# Patient Record
Sex: Female | Born: 1985 | Race: White | Hispanic: No | Marital: Married | Smoking: Never smoker
Health system: Southern US, Community
[De-identification: ages and names within clinical notes are randomized; demographics above are authoritative.]

## PROBLEM LIST (undated history)

## (undated) DIAGNOSIS — E0500 Thyrotoxicosis with diffuse goiter without thyrotoxic crisis or storm: Secondary | ICD-10-CM

## (undated) DIAGNOSIS — Z3483 Encounter for supervision of other normal pregnancy, third trimester: Secondary | ICD-10-CM

## (undated) DIAGNOSIS — Z98891 History of uterine scar from previous surgery: Secondary | ICD-10-CM

## (undated) HISTORY — PX: APPENDECTOMY: SHX54

## (undated) HISTORY — PX: BREAST CYST EXCISION: SHX579

---

## 2016-10-20 DIAGNOSIS — Z8639 Personal history of other endocrine, nutritional and metabolic disease: Secondary | ICD-10-CM | POA: Insufficient documentation

## 2017-05-15 DIAGNOSIS — E890 Postprocedural hypothyroidism: Secondary | ICD-10-CM | POA: Insufficient documentation

## 2017-06-19 LAB — OB RESULTS CONSOLE ABO/RH: RH Type: POSITIVE

## 2017-06-19 LAB — OB RESULTS CONSOLE ANTIBODY SCREEN: ANTIBODY SCREEN: NEGATIVE

## 2017-06-19 LAB — OB RESULTS CONSOLE HEPATITIS B SURFACE ANTIGEN: HEP B S AG: NEGATIVE

## 2017-06-19 LAB — OB RESULTS CONSOLE GC/CHLAMYDIA
CHLAMYDIA, DNA PROBE: NEGATIVE
Gonorrhea: NEGATIVE

## 2017-06-19 LAB — OB RESULTS CONSOLE RPR: RPR: NONREACTIVE

## 2017-06-19 LAB — OB RESULTS CONSOLE HIV ANTIBODY (ROUTINE TESTING): HIV: NONREACTIVE

## 2017-06-19 LAB — OB RESULTS CONSOLE RUBELLA ANTIBODY, IGM: RUBELLA: IMMUNE

## 2017-12-14 LAB — OB RESULTS CONSOLE GBS: GBS: POSITIVE

## 2017-12-20 ENCOUNTER — Encounter (HOSPITAL_COMMUNITY): Payer: Self-pay | Admitting: Obstetrics and Gynecology

## 2017-12-20 DIAGNOSIS — Z3483 Encounter for supervision of other normal pregnancy, third trimester: Secondary | ICD-10-CM

## 2017-12-20 HISTORY — DX: Encounter for supervision of other normal pregnancy, third trimester: Z34.83

## 2017-12-23 ENCOUNTER — Encounter (HOSPITAL_COMMUNITY): Payer: Self-pay | Admitting: *Deleted

## 2018-01-06 ENCOUNTER — Encounter (HOSPITAL_COMMUNITY)
Admission: RE | Admit: 2018-01-06 | Discharge: 2018-01-06 | Disposition: A | Discharge status: Home / Self Care | Payer: 59 | Source: Ambulatory Visit | Attending: Obstetrics and Gynecology | Admitting: Obstetrics and Gynecology

## 2018-01-06 HISTORY — DX: Thyrotoxicosis with diffuse goiter without thyrotoxic crisis or storm: E05.00

## 2018-01-06 LAB — CBC
HEMATOCRIT: 35.4 % — AB (ref 36.0–46.0)
HEMOGLOBIN: 11.9 g/dL — AB (ref 12.0–15.0)
MCH: 28.9 pg (ref 26.0–34.0)
MCHC: 33.6 g/dL (ref 30.0–36.0)
MCV: 85.9 fL (ref 78.0–100.0)
Platelets: 258 10*3/uL (ref 150–400)
RBC: 4.12 MIL/uL (ref 3.87–5.11)
RDW: 13.2 % (ref 11.5–15.5)
WBC: 7.9 10*3/uL (ref 4.0–10.5)

## 2018-01-06 LAB — TYPE AND SCREEN
ABO/RH(D): O POS
ANTIBODY SCREEN: NEGATIVE

## 2018-01-06 LAB — ABO/RH: ABO/RH(D): O POS

## 2018-01-06 NOTE — Anesthesia Preprocedure Evaluation (Addendum)
Anesthesia Evaluation  Patient identified by MRN, date of birth, ID band Patient awake    Reviewed: Allergy & Precautions, NPO status , Patient's Chart, lab work & pertinent test results  Airway Mallampati: II  TM Distance: >3 FB Neck ROM: Full    Dental no notable dental hx. (+) Teeth Intact, Dental Advisory Given   Pulmonary neg pulmonary ROS,    Pulmonary exam normal breath sounds clear to auscultation       Cardiovascular Exercise Tolerance: Good negative cardio ROS Normal cardiovascular exam Rhythm:Regular Rate:Normal     Neuro/Psych negative neurological ROS  negative psych ROS   GI/Hepatic negative GI ROS, Neg liver ROS,   Endo/Other  Hyperthyroidism   Renal/GU negative Renal ROS  negative genitourinary   Musculoskeletal   Abdominal   Peds  Hematology negative hematology ROS (+)   Anesthesia Other Findings   Reproductive/Obstetrics (+) Pregnancy                            Lab Results  Component Value Date   WBC 7.9 01/06/2018   HGB 11.9 (L) 01/06/2018   HCT 35.4 (L) 01/06/2018   MCV 85.9 01/06/2018   PLT 258 01/06/2018    Anesthesia Physical Anesthesia Plan  ASA: II  Anesthesia Plan: Spinal   Post-op Pain Management:    Induction:   PONV Risk Score and Plan: Treatment may vary due to age or medical condition  Airway Management Planned: Nasal Cannula and Natural Airway  Additional Equipment:   Intra-op Plan:   Post-operative Plan:   Informed Consent: I have reviewed the patients History and Physical, chart, labs and discussed the procedure including the risks, benefits and alternatives for the proposed anesthesia with the patient or authorized representative who has indicated his/her understanding and acceptance.   Dental advisory given  Plan Discussed with: CRNA  Anesthesia Plan Comments: (c/s)       Anesthesia Quick Evaluation

## 2018-01-06 NOTE — H&P (Signed)
Christina Sampson is a 32 y.o. female G2P1001 at 39+ for rLTCS.  Pt desires repeat and fetus is breech.  D/w pt r/b/a of LTCS also BTL by salpingectomy.  Pt desires no future fertility.  Relatively uncomplicated PNC - has thyroid dz - on synthroid.  Also w [facility] that resolved.  Dated by LMP cw First tri [place].  Received Tdap in [facility]  OB History    Gravida  2   Para  1   Term  1   Preterm      AB      Living  1     SAB      TAB      Ectopic      Multiple      Live Births  1         G33 G1 6#10, Br, LTCS female G2 present  + abn pap , colpo, nl since'no STD  Past Medical History:  Diagnosis Date  . Graves disease   . Normal pregnancy in multigravida in third trimester 12/20/2017   Past Surgical History:  Procedure Laterality Date  . APPENDECTOMY    . CESAREAN SECTION     Family History: family history includes Diabetes in her father. thyroid disease Social History:  reports that she has never smoked. She has never used smokeless tobacco. She reports that she does not drink alcohol or use drugs.married, wellness coach Meds levothyroxine, PNV All NKDA     Maternal Diabetes: No Genetic Screening: Declined Maternal Ultrasounds/Referrals: Abnormal:  Findings:   Isolated choroid plexus cyst Fetal Ultrasounds or other Referrals:  None Maternal Substance Abuse:  No Significant Maternal Medications:  None Significant Maternal Lab Results:  Lab values include: Group B Strep positive Other Comments:  None  Review of Systems  Constitutional: Negative.   HENT: Negative.   Eyes: Negative.   Respiratory: Negative.   Cardiovascular: Negative.   Gastrointestinal: Negative.   Genitourinary: Negative.   Musculoskeletal: Negative.   Skin: Negative.   Neurological: Negative.   Psychiatric/Behavioral: Negative.    Maternal Medical History:  Contractions: Frequency: irregular.    Fetal activity: Perceived fetal activity is normal.    Prenatal Complications - Diabetes:  none.      Last menstrual period 04/09/2017. Maternal Exam:  Uterine Assessment: Contraction frequency is irregular.   Abdomen: Patient reports no abdominal tenderness. Surgical scars: low transverse.   Fundal height is appropriate for gestation.   Estimated fetal weight is 7-7.5#.   Fetal presentation: breech  Introitus: Normal vulva. Normal vagina.    Physical Exam  Constitutional: She is oriented to person, place, and time. She appears well-developed and well-nourished.  HENT:  Head: Normocephalic and atraumatic.  Cardiovascular: Normal rate and regular rhythm.  Respiratory: Breath sounds normal. No respiratory distress. She has no wheezes.  GI: Soft. Bowel sounds are normal. She exhibits no distension. There is no tenderness.  Musculoskeletal: Normal range of motion.  Neurological: She is alert and oriented to person, place, and time.  Skin: Skin is warm and dry.  Psychiatric: She has a normal mood and affect. Her behavior is normal.    Prenatal labs: ABO, Rh: --/--/O POS, O POS Performed at [facility], [address] [place], [place] [address]  [phone]/28 0930) Antibody: NEG (08/28 0930) Rubella: Immune (02/08 0000) RPR: Nonreactive (02/08 0000)  HBsAg: Negative (02/08 0000)  HIV: Non-reactive (02/08 0000)  GBS: Positive (08/05 0000)  Tdap 6/14  Declined genetic screening H/o LLP - resolved Isolated CP cyst Hgb 13.2/Plt  2493/UrCx neg/Chl neg/GC neg/Varicell aimmune/TSH WNL/glucola 90  Nl anat, post plac, br Good growth, br  Assessment/Plan: 32yo G2P1001 at 39 for rLTCS/BTL D/w pt r/b/a of both Ancef for prophylaxis    Wong Steadham Bovard-Stuckert 01/06/2018, 10:28 PM

## 2018-01-06 NOTE — Patient Instructions (Signed)
Lucilla LameBrooke Sako  01/06/2018   Your procedure is scheduled on:  01/07/2018  Enter through the Main Entrance of [facility] at 0730 AM.  Pick up the phone at the desk and dial [phone]  Call this number if you have problems the morning of surgery:[phone]  Remember:   Do not eat food:(After Midnight) Desps de medianoche.  Do not drink clear liquids: (After Midnight) Desps de medianoche.  Take these medicines the morning of surgery with A SIP OF WATER: may take zantac, do take synthroid   Do not wear jewelry, make-up or nail polish.  Do not wear lotions, powders, or perfumes. Do not wear deodorant.  Do not shave 48 hours prior to surgery.  Do not bring valuables to the hospital.  [facility] is not   responsible for any belongings or valuables brought to the hospital.  Contacts, dentures or bridgework may not be worn into surgery.  Leave suitcase in the car. After surgery it may be brought to your room.  For patients admitted to the hospital, checkout time is 11:00 AM the day of              discharge.    N/A   Please read over the following fact sheets that you were given:   Surgical Site Infection Prevention

## 2018-01-07 ENCOUNTER — Inpatient Hospital Stay (HOSPITAL_COMMUNITY)
Admission: AD | Admit: 2018-01-07 | Discharge: 2018-01-09 | DRG: 785 | Disposition: A | Discharge status: Home / Self Care | Payer: 59 | Attending: Obstetrics and Gynecology | Admitting: Obstetrics and Gynecology

## 2018-01-07 ENCOUNTER — Encounter (HOSPITAL_COMMUNITY): Payer: Self-pay | Admitting: *Deleted

## 2018-01-07 ENCOUNTER — Encounter (HOSPITAL_COMMUNITY)
Admission: AD | Disposition: A | Discharge status: Home / Self Care | Payer: Self-pay | Source: Home / Self Care | Attending: Obstetrics and Gynecology

## 2018-01-07 ENCOUNTER — Inpatient Hospital Stay (HOSPITAL_COMMUNITY): Payer: 59 | Admitting: Anesthesiology

## 2018-01-07 DIAGNOSIS — O321XX0 Maternal care for breech presentation, not applicable or unspecified: Secondary | ICD-10-CM | POA: Diagnosis present

## 2018-01-07 DIAGNOSIS — O34211 Maternal care for low transverse scar from previous cesarean delivery: Secondary | ICD-10-CM | POA: Diagnosis present

## 2018-01-07 DIAGNOSIS — O99284 Endocrine, nutritional and metabolic diseases complicating childbirth: Secondary | ICD-10-CM | POA: Diagnosis present

## 2018-01-07 DIAGNOSIS — Z302 Encounter for sterilization: Secondary | ICD-10-CM

## 2018-01-07 DIAGNOSIS — Z98891 History of uterine scar from previous surgery: Secondary | ICD-10-CM

## 2018-01-07 DIAGNOSIS — O99824 Streptococcus B carrier state complicating childbirth: Secondary | ICD-10-CM | POA: Diagnosis present

## 2018-01-07 DIAGNOSIS — Z3A39 39 weeks gestation of pregnancy: Secondary | ICD-10-CM | POA: Diagnosis not present

## 2018-01-07 DIAGNOSIS — O358XX0 Maternal care for other (suspected) fetal abnormality and damage, not applicable or unspecified: Secondary | ICD-10-CM | POA: Diagnosis present

## 2018-01-07 DIAGNOSIS — E0500 Thyrotoxicosis with diffuse goiter without thyrotoxic crisis or storm: Secondary | ICD-10-CM | POA: Diagnosis present

## 2018-01-07 DIAGNOSIS — Z3483 Encounter for supervision of other normal pregnancy, third trimester: Secondary | ICD-10-CM

## 2018-01-07 HISTORY — DX: History of uterine scar from previous surgery: Z98.891

## 2018-01-07 HISTORY — DX: Encounter for supervision of other normal pregnancy, third trimester: Z34.83

## 2018-01-07 LAB — RPR: RPR: NONREACTIVE

## 2018-01-07 SURGERY — Surgical Case
Anesthesia: Spinal

## 2018-01-07 MED ORDER — ZOLPIDEM TARTRATE 5 MG PO TABS: 5.0000 mg | ORAL_TABLET | Freq: Every evening | ORAL | Status: DC | PRN

## 2018-01-07 MED ORDER — NALBUPHINE HCL 10 MG/ML IJ SOLN: 5.0000 mg | Freq: Once | INTRAMUSCULAR | Status: DC | PRN

## 2018-01-07 MED ORDER — MEPERIDINE HCL 25 MG/ML IJ SOLN: 6.2500 mg | INTRAMUSCULAR | Status: DC | PRN

## 2018-01-07 MED ORDER — PHENYLEPHRINE HCL 10 MG/ML IJ SOLN
INTRAMUSCULAR | Status: DC | PRN
  Administered 2018-01-07: 80 ug via INTRAVENOUS

## 2018-01-07 MED ORDER — LEVOTHYROXINE SODIUM 137 MCG PO TABS
137.0000 ug | ORAL_TABLET | Freq: Every day | ORAL | Status: DC
  Administered 2018-01-08 – 2018-01-09 (×2): 137 ug via ORAL
  Filled 2018-01-07 (×2): qty 1

## 2018-01-07 MED ORDER — OXYTOCIN 10 UNIT/ML IJ SOLN
INTRAMUSCULAR | Status: AC
  Filled 2018-01-07: qty 4

## 2018-01-07 MED ORDER — SCOPOLAMINE 1 MG/3DAYS TD PT72
MEDICATED_PATCH | TRANSDERMAL | Status: AC
  Filled 2018-01-07: qty 1

## 2018-01-07 MED ORDER — KETOROLAC TROMETHAMINE 30 MG/ML IJ SOLN: 30.0000 mg | Freq: Four times a day (QID) | INTRAMUSCULAR | Status: AC | PRN

## 2018-01-07 MED ORDER — FENTANYL CITRATE (PF) 100 MCG/2ML IJ SOLN
INTRAMUSCULAR | Status: DC | PRN
  Administered 2018-01-07: 15 ug via INTRATHECAL

## 2018-01-07 MED ORDER — PROMETHAZINE HCL 25 MG/ML IJ SOLN: 6.2500 mg | INTRAMUSCULAR | Status: DC | PRN

## 2018-01-07 MED ORDER — SCOPOLAMINE 1 MG/3DAYS TD PT72
MEDICATED_PATCH | TRANSDERMAL | Status: DC | PRN
  Administered 2018-01-07: 1 via TRANSDERMAL

## 2018-01-07 MED ORDER — SCOPOLAMINE 1 MG/3DAYS TD PT72: 1.0000 | MEDICATED_PATCH | Freq: Once | TRANSDERMAL | Status: DC

## 2018-01-07 MED ORDER — NALOXONE HCL 0.4 MG/ML IJ SOLN: 0.4000 mg | INTRAMUSCULAR | Status: DC | PRN

## 2018-01-07 MED ORDER — WITCH HAZEL-GLYCERIN EX PADS: 1.0000 "application " | MEDICATED_PAD | CUTANEOUS | Status: DC | PRN

## 2018-01-07 MED ORDER — LACTATED RINGERS IV SOLN
INTRAVENOUS | Status: DC
  Administered 2018-01-07 (×3): via INTRAVENOUS

## 2018-01-07 MED ORDER — DIPHENHYDRAMINE HCL 50 MG/ML IJ SOLN: 12.5000 mg | INTRAMUSCULAR | Status: DC | PRN

## 2018-01-07 MED ORDER — CEFAZOLIN SODIUM-DEXTROSE 2-4 GM/100ML-% IV SOLN
2.0000 g | INTRAVENOUS | Status: AC
  Administered 2018-01-07: 2 g via INTRAVENOUS
  Filled 2018-01-07: qty 100

## 2018-01-07 MED ORDER — DIPHENHYDRAMINE HCL 25 MG PO CAPS: 25.0000 mg | ORAL_CAPSULE | Freq: Four times a day (QID) | ORAL | Status: DC | PRN

## 2018-01-07 MED ORDER — OXYTOCIN 10 UNIT/ML IJ SOLN
INTRAVENOUS | Status: DC | PRN
  Administered 2018-01-07: 40 [IU] via INTRAVENOUS

## 2018-01-07 MED ORDER — FENTANYL CITRATE (PF) 100 MCG/2ML IJ SOLN
INTRAMUSCULAR | Status: AC
  Filled 2018-01-07: qty 2

## 2018-01-07 MED ORDER — PHENYLEPHRINE 8 MG IN D5W 100 ML (0.08MG/ML) PREMIX OPTIME
INJECTION | INTRAVENOUS | Status: DC | PRN
  Administered 2018-01-07: 60 ug/min via INTRAVENOUS

## 2018-01-07 MED ORDER — FAMOTIDINE 20 MG PO TABS
20.0000 mg | ORAL_TABLET | Freq: Every day | ORAL | Status: DC
  Administered 2018-01-07: 20 mg via ORAL
  Filled 2018-01-07 (×3): qty 1

## 2018-01-07 MED ORDER — SODIUM CHLORIDE 0.9% FLUSH: 3.0000 mL | INTRAVENOUS | Status: DC | PRN

## 2018-01-07 MED ORDER — SIMETHICONE 80 MG PO CHEW
80.0000 mg | CHEWABLE_TABLET | Freq: Three times a day (TID) | ORAL | Status: DC
  Administered 2018-01-07 – 2018-01-09 (×5): 80 mg via ORAL
  Filled 2018-01-07 (×5): qty 1

## 2018-01-07 MED ORDER — PHENYLEPHRINE 8 MG IN D5W 100 ML (0.08MG/ML) PREMIX OPTIME
INJECTION | INTRAVENOUS | Status: AC
  Filled 2018-01-07: qty 100

## 2018-01-07 MED ORDER — LACTATED RINGERS IV SOLN
INTRAVENOUS | Status: DC | PRN
  Administered 2018-01-07: 10:00:00 via INTRAVENOUS

## 2018-01-07 MED ORDER — DIBUCAINE 1 % RE OINT: 1.0000 "application " | TOPICAL_OINTMENT | RECTAL | Status: DC | PRN

## 2018-01-07 MED ORDER — ONDANSETRON HCL 4 MG/2ML IJ SOLN: 4.0000 mg | Freq: Three times a day (TID) | INTRAMUSCULAR | Status: DC | PRN

## 2018-01-07 MED ORDER — ONDANSETRON HCL 4 MG/2ML IJ SOLN
INTRAMUSCULAR | Status: AC
  Filled 2018-01-07: qty 2

## 2018-01-07 MED ORDER — MORPHINE SULFATE (PF) 0.5 MG/ML IJ SOLN
INTRAMUSCULAR | Status: DC | PRN
  Administered 2018-01-07: .15 mg via INTRATHECAL

## 2018-01-07 MED ORDER — OXYTOCIN 40 UNITS IN LACTATED RINGERS INFUSION - SIMPLE MED: 2.5000 [IU]/h | INTRAVENOUS | Status: AC

## 2018-01-07 MED ORDER — DEXAMETHASONE SODIUM PHOSPHATE 4 MG/ML IJ SOLN
INTRAMUSCULAR | Status: AC
  Filled 2018-01-07: qty 1

## 2018-01-07 MED ORDER — BUPIVACAINE IN DEXTROSE 0.75-8.25 % IT SOLN
INTRATHECAL | Status: DC | PRN
  Administered 2018-01-07: 1.6 mL via INTRATHECAL

## 2018-01-07 MED ORDER — LACTATED RINGERS IV SOLN
INTRAVENOUS | Status: DC
  Administered 2018-01-07: 22:00:00 via INTRAVENOUS

## 2018-01-07 MED ORDER — OXYCODONE HCL 5 MG PO TABS: 10.0000 mg | ORAL_TABLET | ORAL | Status: DC | PRN

## 2018-01-07 MED ORDER — NALOXONE HCL 4 MG/10ML IJ SOLN
1.0000 ug/kg/h | INTRAVENOUS | Status: DC | PRN
  Filled 2018-01-07: qty 5

## 2018-01-07 MED ORDER — SODIUM CHLORIDE 0.9 % IR SOLN
Status: DC | PRN
  Administered 2018-01-07: 1

## 2018-01-07 MED ORDER — OXYCODONE HCL 5 MG PO TABS: 5.0000 mg | ORAL_TABLET | ORAL | Status: DC | PRN

## 2018-01-07 MED ORDER — DEXAMETHASONE SODIUM PHOSPHATE 4 MG/ML IJ SOLN
INTRAMUSCULAR | Status: DC | PRN
  Administered 2018-01-07: 4 mg via INTRAVENOUS

## 2018-01-07 MED ORDER — SENNOSIDES-DOCUSATE SODIUM 8.6-50 MG PO TABS
2.0000 | ORAL_TABLET | ORAL | Status: DC
  Administered 2018-01-07 – 2018-01-08 (×2): 2 via ORAL
  Filled 2018-01-07 (×2): qty 2

## 2018-01-07 MED ORDER — SIMETHICONE 80 MG PO CHEW
80.0000 mg | CHEWABLE_TABLET | ORAL | Status: DC | PRN
  Administered 2018-01-08: 80 mg via ORAL
  Filled 2018-01-07: qty 1

## 2018-01-07 MED ORDER — KETOROLAC TROMETHAMINE 30 MG/ML IJ SOLN
30.0000 mg | Freq: Four times a day (QID) | INTRAMUSCULAR | Status: AC | PRN
  Administered 2018-01-07: 30 mg via INTRAMUSCULAR

## 2018-01-07 MED ORDER — NALBUPHINE HCL 10 MG/ML IJ SOLN: 5.0000 mg | INTRAMUSCULAR | Status: DC | PRN

## 2018-01-07 MED ORDER — PHENYLEPHRINE 40 MCG/ML (10ML) SYRINGE FOR IV PUSH (FOR BLOOD PRESSURE SUPPORT)
PREFILLED_SYRINGE | INTRAVENOUS | Status: AC
  Filled 2018-01-07: qty 10

## 2018-01-07 MED ORDER — ACETAMINOPHEN 325 MG PO TABS: 650.0000 mg | ORAL_TABLET | ORAL | Status: DC | PRN

## 2018-01-07 MED ORDER — MORPHINE SULFATE (PF) 0.5 MG/ML IJ SOLN
INTRAMUSCULAR | Status: AC
  Filled 2018-01-07: qty 10

## 2018-01-07 MED ORDER — DIPHENHYDRAMINE HCL 25 MG PO CAPS
25.0000 mg | ORAL_CAPSULE | ORAL | Status: DC | PRN
  Filled 2018-01-07: qty 1

## 2018-01-07 MED ORDER — KETOROLAC TROMETHAMINE 30 MG/ML IJ SOLN
INTRAMUSCULAR | Status: AC
  Filled 2018-01-07: qty 1

## 2018-01-07 MED ORDER — ACETAMINOPHEN 10 MG/ML IV SOLN: 1000.0000 mg | Freq: Once | INTRAVENOUS | Status: DC | PRN

## 2018-01-07 MED ORDER — ONDANSETRON HCL 4 MG/2ML IJ SOLN
INTRAMUSCULAR | Status: DC | PRN
  Administered 2018-01-07: 4 mg via INTRAVENOUS

## 2018-01-07 MED ORDER — SIMETHICONE 80 MG PO CHEW
80.0000 mg | CHEWABLE_TABLET | ORAL | Status: DC
  Administered 2018-01-07: 80 mg via ORAL
  Filled 2018-01-07: qty 1

## 2018-01-07 MED ORDER — PRENATAL MULTIVITAMIN CH
1.0000 | ORAL_TABLET | Freq: Every day | ORAL | Status: DC
  Administered 2018-01-08 – 2018-01-09 (×2): 1 via ORAL
  Filled 2018-01-07 (×2): qty 1

## 2018-01-07 MED ORDER — IBUPROFEN 800 MG PO TABS
800.0000 mg | ORAL_TABLET | Freq: Three times a day (TID) | ORAL | Status: DC
  Administered 2018-01-07 – 2018-01-09 (×5): 800 mg via ORAL
  Filled 2018-01-07 (×5): qty 1

## 2018-01-07 MED ORDER — HYDROCODONE-ACETAMINOPHEN 7.5-325 MG PO TABS: 1.0000 | ORAL_TABLET | Freq: Once | ORAL | Status: DC | PRN

## 2018-01-07 MED ORDER — HYDROMORPHONE HCL 1 MG/ML IJ SOLN: 0.2500 mg | INTRAMUSCULAR | Status: DC | PRN

## 2018-01-07 MED ORDER — MENTHOL 3 MG MT LOZG: 1.0000 | LOZENGE | OROMUCOSAL | Status: DC | PRN

## 2018-01-07 MED ORDER — COCONUT OIL OIL: 1.0000 "application " | TOPICAL_OIL | Status: DC | PRN

## 2018-01-07 SURGICAL SUPPLY — 34 items
BENZOIN TINCTURE PRP APPL 2/3 (GAUZE/BANDAGES/DRESSINGS) ×2 IMPLANT
CHLORAPREP W/TINT 26ML (MISCELLANEOUS) ×2 IMPLANT
CLAMP CORD UMBIL (MISCELLANEOUS) ×2 IMPLANT
CLOTH BEACON ORANGE TIMEOUT ST (SAFETY) ×2 IMPLANT
DRSG OPSITE POSTOP 4X10 (GAUZE/BANDAGES/DRESSINGS) ×2 IMPLANT
ELECT REM PT RETURN 9FT ADLT (ELECTROSURGICAL) ×2
ELECTRODE REM PT RTRN 9FT ADLT (ELECTROSURGICAL) ×1 IMPLANT
EXTRACTOR VACUUM M CUP 4 TUBE (SUCTIONS) IMPLANT
GLOVE BIO SURGEON STRL SZ 6.5 (GLOVE) ×2 IMPLANT
GLOVE BIOGEL PI IND STRL 7.0 (GLOVE) ×1 IMPLANT
GLOVE BIOGEL PI INDICATOR 7.0 (GLOVE) ×1
GOWN STRL REUS W/TWL LRG LVL3 (GOWN DISPOSABLE) ×4 IMPLANT
KIT ABG SYR 3ML LUER SLIP (SYRINGE) IMPLANT
NEEDLE HYPO 25X5/8 SAFETYGLIDE (NEEDLE) IMPLANT
NS IRRIG 1000ML POUR BTL (IV SOLUTION) ×2 IMPLANT
PACK C SECTION WH (CUSTOM PROCEDURE TRAY) ×2 IMPLANT
PAD OB MATERNITY 4.3X12.25 (PERSONAL CARE ITEMS) ×2 IMPLANT
PENCIL SMOKE EVAC W/HOLSTER (ELECTROSURGICAL) ×2 IMPLANT
RTRCTR C-SECT PINK 25CM LRG (MISCELLANEOUS) ×2 IMPLANT
STRIP CLOSURE SKIN 1/2X4 (GAUZE/BANDAGES/DRESSINGS) ×2 IMPLANT
SUT MNCRL 0 VIOLET CTX 36 (SUTURE) ×2 IMPLANT
SUT MONOCRYL 0 CTX 36 (SUTURE) ×2
SUT PLAIN 1 NONE 54 (SUTURE) ×2 IMPLANT
SUT PLAIN 2 0 (SUTURE) ×1
SUT PLAIN 2 0 XLH (SUTURE) ×2 IMPLANT
SUT PLAIN ABS 2-0 CT1 27XMFL (SUTURE) ×1 IMPLANT
SUT VIC AB 0 CT1 27 (SUTURE) ×2
SUT VIC AB 0 CT1 27XBRD ANBCTR (SUTURE) ×2 IMPLANT
SUT VIC AB 2-0 CT1 27 (SUTURE) ×1
SUT VIC AB 2-0 CT1 TAPERPNT 27 (SUTURE) ×1 IMPLANT
SUT VIC AB 4-0 KS 27 (SUTURE) ×2 IMPLANT
SYR BULB IRRIGATION 50ML (SYRINGE) ×2 IMPLANT
TOWEL OR 17X24 6PK STRL BLUE (TOWEL DISPOSABLE) ×2 IMPLANT
TRAY FOLEY W/BAG SLVR 14FR LF (SET/KITS/TRAYS/PACK) ×2 IMPLANT

## 2018-01-07 NOTE — Op Note (Signed)
Christina Sampson, Christina Sampson MEDICAL RECORD [id number] ACCOUNT [id number] DATE OF BIRTH:08-25-1985 FACILITY: [facility] LOCATION: [id number] PHYSICIAN:Mehtab Dolberry BOVARD-STUCKERT, MD  OPERATIVE REPORT  DATE OF PROCEDURE:  01/07/2018  PREOPERATIVE DIAGNOSES:  Intrauterine pregnancy at term history of low transverse cesarean section, desires repeat, undesired fertility.  POSTOPERATIVE DIAGNOSES:  Intrauterine pregnancy at term history of low transverse cesarean section, desires repeat, undesired fertility, delivered.  PROCEDURE:  Repeat low transverse cesarean section with bilateral tubal ligation by bilateral salpingectomy.  SURGEON:  Sherron Monday, M.D.  ASSISTANT:  Annice Pih, RNFA.  ANESTHESIA:  Spinal.  ESTIMATED BLOOD LOSS:  303 mL  URINE OUTPUT AND IV FLUIDS:  Per anesthesia.  Urine was clear.  Urine was clear at the end of the case.  FINDINGS:  A viable female infant named Jackson at 9:52 a.m. with Apgars of 9 at 1 minute and 9 at 5 minutes and a weight pending at the time of dictation.  Normal uterus, tubes, and ovaries were also noted.  COMPLICATIONS:  None.  PATHOLOGY:  Placenta to labor and delivery, bilateral tubal segments to pathology.  DESCRIPTION OF PROCEDURE:  After informed consent was reviewed with the patient and her spouse, she was transported to the operating room where spinal anesthesia was placed and found to be adequate.  She was then returned to the supine position with a  leftward tilt, prepped and draped in the normal sterile fashion.  A Foley catheter was sterilely placed.  A Pfannenstiel skin incision was made at the level 2 fingerbreadths above the pubic symphysis where her previous incision was carried through the  underlying layer of fascia sharply.  The fascia was incised in the midline.  The incision was extended laterally with Mayo scissors.  Superior aspect of the fascial incision was grasped with Kocher clamps, elevated and the rectus muscles were dissected   off both bluntly and sharply.  The midline was identified.  The peritoneum was entered bluntly and the incision was extended superiorly and inferiorly with good visualization of the bladder.  An Alexis skin retractor was placed carefully making sure that  no bowel was entrapped.  The bladder flap was created.  The uterus was incised in a transverse fashion and the incision was extended manually.  The infant was delivered from a frank breech presentation.  There was meconium from the pressure of delivery;  however, there was none prior to the stress of delivery.  The infant was delivered up to the level of the shoulders.  Shoulders were then reduced and the head was flexed and delivered.  He was dried and stimulated.  Mouth was suctioned, cord was clamped  and cut after waiting a minute.  The infant was handed off to the waiting NICU staff. The placenta was expressed.  The uterus was cleared of all clot and debris.  Uterine incision was closed in two layers of 0 Monocryl, first of which is running locked  and the second as an imbricating layer.  The uterus was noted to be hemostatic.  Attention was turned to the tubes.  First, the left tube was identified, followed out to the fimbriated end and using a Tresa Endo was excised and then doubly ligated with plain  gut.  This was noted to be hemostatic.  The right tube was then identified, followed out to the fimbriated end and excised using a Tresa Endo, also found to be hemostatic after doubly ligating it with plain gut.  The Alexis retractor was removed.  The  peritoneum was reapproximated with 2-0 Vicryl.  The subfascial planes were inspected and found to be hemostatic and the fascia was then reapproximated with 0 Vicryl from either corner overlapping in the midline.  The subcuticular plane was inspected and  made hemostatic with Bovie cautery.  The dead space was then closed with plain gut.  The skin was closed with a 4-0 Vicryl on a Keith needle.  Benzoin and  Steri-Strips were applied.  The patient tolerated the procedure well.  Sponge, lap and needle count  was correct x2 per the operating staff.  TN/NUANCE  D:01/07/2018 T:01/07/2018 JOB:[id number]

## 2018-01-07 NOTE — Anesthesia Procedure Notes (Signed)
Spinal  Patient location during procedure: OB Start time: 01/07/2018 9:28 AM End time: 01/07/2018 9:36 AM Staffing Anesthesiologist: Trevor IhaHouser, Mayanna Garlitz A, MD Performed: anesthesiologist  Preanesthetic Checklist Completed: patient identified, surgical consent, pre-op evaluation, timeout performed, IV checked, risks and benefits discussed and monitors and equipment checked Spinal Block Patient position: sitting Prep: site prepped and draped and DuraPrep Patient monitoring: heart rate, cardiac monitor, continuous pulse ox and blood pressure Approach: midline Location: L3-4 Injection technique: single-shot Needle Needle type: Pencan  Needle gauge: 24 G Needle length: 10 cm Needle insertion depth: 7 cm Assessment Sensory level: T4 Additional Notes 1 attempt at L4-5 by SRNA student , 2 attempts at L3 - L4,  Pt tolerated procedure well

## 2018-01-07 NOTE — Transfer of Care (Signed)
Immediate Anesthesia Transfer of Care Note  Patient: Christina Sampson  Procedure(s) Performed: REPEAT CESAREAN SECTION (N/A )  Patient Location: PACU  Anesthesia Type:Spinal  Level of Consciousness: awake, alert  and oriented  Airway & Oxygen Therapy: Patient Spontanous Breathing  Post-op Assessment: Report given to RN and Post -op Vital signs reviewed and stable  Post vital signs: Reviewed and stable  Last Vitals:  Vitals Value Taken Time  BP    Temp    Pulse    Resp    SpO2      Last Pain:  Vitals:   01/07/18 0758  TempSrc: Oral  PainSc: 0-No pain      Patients Stated Pain Goal: 4 (01/07/18 0758)  Complications: No apparent anesthesia complications

## 2018-01-07 NOTE — Lactation Note (Signed)
This note was copied from a baby's chart. Lactation Consultation Note  Patient Name: Christina Sampson Inabinet RUEAV'WToday's Date: 01/07/2018 Reason for consult: Initial assessment;Other (Comment);Term(2nd baby, per mom pumped x 1 year due to Difficult time BF baby, )  Baby is 8 hours old, " [name] "  Per mom the baby latched right after delivery in the OR.  Baby awake, and LC assisted mom to latch after showing her hand expressing, and mom able  To return demo after several tries.  LC recommended to mom prior to latch on the 1st breast - breast massage, hand express, pre-pump if needed ( #24 F is a good fit ) and reverse pressure.  Baby opened wide and latched with depth with assistance, and fed for 36 mins, multiple swallows noted and increased with breast compressions. Per mom comfortable with entire latch.  Nipple slightly slanted when baby released. Football position used.  Mom and dad expressed excitement that the baby is breast feeding so well, because their 1st baby  Did not latch well.  LC instructed mom on the use of breast shells between feedings except  When sleeping, and hand pump ( #24 F is a good fit ).  Mother informed of post-discharge support and given phone number to the lactation department, including services for phone call assistance; out-patient appointments; and breastfeeding support group. List of other breastfeeding resources in the community given in the handout. Encouraged mother to call for problems or concerns related to breastfeeding.   Maternal Data Has patient been taught Hand Expression?: Yes(mom returned demo/ with several drops ) Does the patient have breastfeeding experience prior to this delivery?: Yes  Feeding Feeding Type: Breast Fed Length of feed: 36 min(multiple swallows/ increased with breast compressions )  LATCH Score Latch: Grasps breast easily, tongue down, lips flanged, rhythmical sucking.  Audible Swallowing: Spontaneous and intermittent  Type of  Nipple: Everted at rest and after stimulation(short shaft nipple/ some areola edema - indicating shells , and reverse pressure )  Comfort (Breast/Nipple): Soft / non-tender  Hold (Positioning): Assistance needed to correctly position infant at breast and maintain latch.  LATCH Score: 9  Interventions Interventions: Breast feeding basics reviewed;Assisted with latch;Skin to skin;Breast massage;Hand express;Breast compression;Adjust position;Support pillows;Position options;Expressed milk;Shells;Hand pump  Lactation Tools Discussed/Used Tools: Shells;Pump Shell Type: Inverted Breast pump type: Manual WIC Program: No Pump Review: Setup, frequency, and cleaning;Milk Storage Initiated by:: MAI  Date initiated:: 01/07/18   Consult Status Consult Status: Follow-up Date: 01/08/18 Follow-up type: In-patient    Matilde SprangMargaret Ann Krishana Lutze 01/07/2018, 6:46 PM

## 2018-01-07 NOTE — Addendum Note (Signed)
Addendum  created 01/07/18 1946 by Anh Bigos, CRNA   Sign clinical note    

## 2018-01-07 NOTE — Anesthesia Postprocedure Evaluation (Signed)
Anesthesia Post Note  Patient: Lucilla LameBrooke Nie  Procedure(s) Performed: REPEAT CESAREAN SECTION (N/A )     Patient location during evaluation: PACU Anesthesia Type: Spinal Level of consciousness: oriented and awake and alert Pain management: pain level controlled Vital Signs Assessment: post-procedure vital signs reviewed and stable Respiratory status: spontaneous breathing, respiratory function stable and patient connected to nasal cannula oxygen Cardiovascular status: blood pressure returned to baseline and stable Postop Assessment: no headache, no backache and no apparent nausea or vomiting Anesthetic complications: no    Last Vitals:  Vitals:   01/07/18 1500 01/07/18 1545  BP: 97/64 96/67  Pulse: (!) 52 84  Resp: 18 18  Temp: 36.6 C 36.4 C  SpO2: 99% 99%    Last Pain:  Vitals:   01/07/18 1545  TempSrc: Axillary  PainSc: 3    Pain Goal: Patients Stated Pain Goal: 4 (01/07/18 0758)               Trevor IhaStephen A Justa Hatchell

## 2018-01-07 NOTE — Anesthesia Postprocedure Evaluation (Signed)
Anesthesia Post Note  Patient: Christina Sampson  Procedure(s) Performed: REPEAT CESAREAN SECTION (N/A )     Patient location during evaluation: Mother Baby Anesthesia Type: Spinal Level of consciousness: oriented and awake and alert Pain management: pain level controlled Vital Signs Assessment: post-procedure vital signs reviewed and stable Respiratory status: spontaneous breathing and respiratory function stable Cardiovascular status: blood pressure returned to baseline and stable Postop Assessment: no headache, no backache and no apparent nausea or vomiting Anesthetic complications: no    Last Vitals:  Vitals:   01/07/18 1500 01/07/18 1545  BP: 97/64 96/67  Pulse: (!) 52 84  Resp: 18 18  Temp: 36.6 C 36.4 C  SpO2: 99% 99%    Last Pain:  Vitals:   01/07/18 1545  TempSrc: Axillary  PainSc: 3    Pain Goal: Patients Stated Pain Goal: 4 (01/07/18 0758)               Junious SilkGILBERT,Lydiann Bonifas

## 2018-01-07 NOTE — Brief Op Note (Signed)
01/07/2018  10:54 AM  PATIENT:  Christina Sampson  32 y.o. female  PRE-OPERATIVE DIAGNOSIS:  repeat C-Section undesired fertility   POST-OPERATIVE DIAGNOSIS:  repeat C-Section undesired fertility   PROCEDURE:  Procedure(s) with comments: REPEAT CESAREAN SECTION (N/A) - Heather, RNFA  SURGEON:  Surgeon(s) and Role:    * Bovard-Stuckert, Clarence Dunsmore, MD - Primary  ASSISTANTS: Krietemeyer, heather RNFA   ANESTHESIA:   spinal  EBL:  303 mL  uop and IVF per anesthesia, clear urine at end of case.    FINDINGS: viable female infant at 9:52, apgars 9/9, wt P; nl uterus, tubes and ovaries  BLOOD ADMINISTERED:none  DRAINS: Urinary Catheter (Foley)   LOCAL MEDICATIONS USED:  NONE  SPECIMEN:  Source of Specimen:  Placenta; B tubal segments  DISPOSITION OF SPECIMEN:  L&D; pathology  COUNTS:  YES  TOURNIQUET:  * No tourniquets in log *  DICTATION: .Other Dictation: Dictation Number [id number]  PLAN OF CARE: Admit to inpatient   PATIENT DISPOSITION:  PACU - hemodynamically stable.   Delay start of Pharmacological VTE agent (>24hrs) due to surgical blood loss or risk of bleeding: not applicable

## 2018-01-07 NOTE — Interval H&P Note (Signed)
History and Physical Interval Note:  01/07/2018 9:01 AM  Christina Sampson  has presented today for surgery, with the diagnosis of repeat C-Section  The various methods of treatment have been discussed with the patient and family. After consideration of risks, benefits and other options for treatment, the patient has consented to  Procedure(s) with comments: REPEAT CESAREAN SECTION (N/A) - Heather, RNFA as a surgical intervention .  The patient's history has been reviewed, patient examined, no change in status, stable for surgery.  I have reviewed the patient's chart and labs.  Questions were answered to the patient's satisfaction.     Melinda Gwinner Bovard-Stuckert

## 2018-01-08 LAB — CBC
HCT: 31.3 % — ABNORMAL LOW (ref 36.0–46.0)
Hemoglobin: 10.4 g/dL — ABNORMAL LOW (ref 12.0–15.0)
MCH: 28.4 pg (ref 26.0–34.0)
MCHC: 33.2 g/dL (ref 30.0–36.0)
MCV: 85.5 fL (ref 78.0–100.0)
PLATELETS: 207 10*3/uL (ref 150–400)
RBC: 3.66 MIL/uL — AB (ref 3.87–5.11)
RDW: 13.3 % (ref 11.5–15.5)
WBC: 11.2 10*3/uL — ABNORMAL HIGH (ref 4.0–10.5)

## 2018-01-08 LAB — BIRTH TISSUE RECOVERY COLLECTION (PLACENTA DONATION)

## 2018-01-08 NOTE — Addendum Note (Signed)
Addendum  created 01/08/18 1851 by Trevor IhaHouser, Stephen A, MD   Intraprocedure Staff edited

## 2018-01-08 NOTE — Progress Notes (Signed)
Subjective: Postpartum Day #1: Cesarean Delivery Patient reports incisional pain, tolerating PO and no problems voiding.    Objective: Vital signs in last 24 hours: Temp:  [97.4 F (36.3 C)-98.8 F (37.1 C)] 98.2 F (36.8 C) (08/30 0400) Pulse Rate:  [52-84] 58 (08/30 0400) Resp:  [10-18] 18 (08/30 0400) BP: (86-126)/(51-80) 89/54 (08/30 0400) SpO2:  [97 %-100 %] 98 % (08/30 0400)  Physical Exam:  General: alert Lochia: appropriate Uterine Fundus: firm Incision: dressing 1/2 saturated with bloody drainage   Recent Labs    01/06/18 0930 01/08/18 0610  HGB 11.9* 10.4*  HCT 35.4* 31.3*    Assessment/Plan: Status post Cesarean section. Doing well postoperatively.  Continue current care, ambulate, change dressing and monitor for continued bleeding.  Christina Sampson 01/08/2018, 8:40 AM

## 2018-01-08 NOTE — Progress Notes (Signed)
Discontinued foley catheter at 0400, emptied 1300 ml of urine from the foley bag.  Patient knows to call for bathroom assistance and have 6 hours to void.

## 2018-01-09 MED ORDER — IBUPROFEN 800 MG PO TABS: 800.0000 mg | ORAL_TABLET | Freq: Three times a day (TID) | ORAL | 0 refills | Status: AC

## 2018-01-09 NOTE — Discharge Summary (Signed)
    OB Discharge Summary     Patient Name: Christina Sampson DOB: 07/08/1985 MRN: [id number]  Date of admission: 01/07/2018 Delivering MD: Sherian ReinBOVARD-STUCKERT, JODY   Date of discharge: 01/09/2018  Admitting diagnosis: repeat C-Section Intrauterine pregnancy: 2580w0d     Secondary diagnosis:  Principal Problem:   Status post repeat low transverse cesarean section Active Problems:   Normal pregnancy in multigravida in third trimester      Discharge diagnosis: Term Pregnancy Delivered             Hospital course:  Sceduled C/S   32 y.o. yo G2P2002 at 6080w0d was admitted to the hospital 01/07/2018 for scheduled cesarean section with the following indication:Elective Repeat.  Membrane Rupture Time/Date: 9:49 AM ,01/07/2018   Patient delivered a Viable infant.01/07/2018  Details of operation can be found in separate operative note.  Pateint had an uncomplicated postpartum course.  She is ambulating, tolerating a regular diet, passing flatus, and urinating well. Patient is discharged home in stable condition on  01/09/18         Physical exam  Vitals:   01/08/18 0400 01/08/18 1439 01/08/18 2243 01/09/18 0543  BP: (!) 89/54 (!) 85/56 101/60 94/62  Pulse: (!) 58 (!) 53 65 (!) 56  Resp: 18 17 18 18   Temp: 98.2 F (36.8 C) 97.7 F (36.5 C) 98.2 F (36.8 C) (!) 97.5 F (36.4 C)  TempSrc: Oral Oral Oral Oral  SpO2: 98% 98%    Weight:      Height:       General: alert Lochia: appropriate Uterine Fundus: firm Incision: Healing well with no significant drainage  Labs: Lab Results  Component Value Date   WBC 11.2 (H) 01/08/2018   HGB 10.4 (L) 01/08/2018   HCT 31.3 (L) 01/08/2018   MCV 85.5 01/08/2018   PLT 207 01/08/2018   No flowsheet data found.  Discharge instruction: per After Visit Summary and "Baby and Me Booklet".  After visit meds:  Allergies as of 01/09/2018   No Known Allergies     Medication List    TAKE these medications   ibuprofen 800 MG tablet Commonly known as:   ADVIL,MOTRIN Take 1 tablet (800 mg total) by mouth every 8 (eight) hours.   levothyroxine 137 MCG tablet Commonly known as:  SYNTHROID, LEVOTHROID Take 137 mcg by mouth daily before breakfast.   multivitamin-prenatal 27-0.8 MG Tabs tablet Take 1 tablet by mouth daily.   ranitidine 150 MG tablet Commonly known as:  ZANTAC Take 150 mg by mouth 2 (two) times daily.       Diet: routine diet  Activity: Advance as tolerated. Pelvic rest for 6 weeks.   Outpatient follow up:2 weeks  Newborn Data: Live born female  Birth Weight: 6 lb 6.3 oz (2900 g) APGAR: 9, 9  Newborn Delivery   Birth date/time:  01/07/2018 09:52:00 Delivery type:  C-Section, Low Transverse Trial of labor:  No C-section categorization:  Repeat     Baby Feeding: Breast Disposition:home with mother   01/09/2018 Zenaida Nieceodd D Yuki Brunsman, MD

## 2018-01-09 NOTE — Progress Notes (Signed)
POD #2 Doing well Afeb, VSS Abd- soft, fundus firm, incision intact with clean dressing D/c home

## 2018-01-09 NOTE — Discharge Instructions (Signed)
As per discharge pamphlet °

## 2018-02-02 DIAGNOSIS — E038 Other specified hypothyroidism: Secondary | ICD-10-CM | POA: Insufficient documentation

## 2018-05-25 ENCOUNTER — Other Ambulatory Visit: Payer: Self-pay | Admitting: Obstetrics and Gynecology

## 2018-05-25 DIAGNOSIS — N6310 Unspecified lump in the right breast, unspecified quadrant: Secondary | ICD-10-CM

## 2018-06-04 ENCOUNTER — Ambulatory Visit
Admission: RE | Admit: 2018-06-04 | Discharge: 2018-06-04 | Disposition: A | Discharge status: Home / Self Care | Payer: 59 | Source: Ambulatory Visit | Attending: Obstetrics and Gynecology | Admitting: Obstetrics and Gynecology

## 2018-06-04 ENCOUNTER — Other Ambulatory Visit: Payer: 59

## 2018-06-04 DIAGNOSIS — N6310 Unspecified lump in the right breast, unspecified quadrant: Secondary | ICD-10-CM

## 2019-01-21 ENCOUNTER — Other Ambulatory Visit: Payer: Self-pay

## 2019-01-21 ENCOUNTER — Other Ambulatory Visit: Payer: Self-pay | Admitting: Podiatry

## 2019-01-21 ENCOUNTER — Ambulatory Visit (INDEPENDENT_AMBULATORY_CARE_PROVIDER_SITE_OTHER): Payer: 59

## 2019-01-21 ENCOUNTER — Ambulatory Visit (INDEPENDENT_AMBULATORY_CARE_PROVIDER_SITE_OTHER): Payer: 59 | Admitting: Podiatry

## 2019-01-21 DIAGNOSIS — M19071 Primary osteoarthritis, right ankle and foot: Secondary | ICD-10-CM | POA: Diagnosis not present

## 2019-01-21 DIAGNOSIS — M19079 Primary osteoarthritis, unspecified ankle and foot: Secondary | ICD-10-CM

## 2019-01-21 DIAGNOSIS — M2530 Other instability, unspecified joint: Secondary | ICD-10-CM

## 2019-01-21 DIAGNOSIS — M357 Hypermobility syndrome: Secondary | ICD-10-CM | POA: Diagnosis not present

## 2019-01-21 DIAGNOSIS — M79672 Pain in left foot: Secondary | ICD-10-CM

## 2019-01-21 DIAGNOSIS — M79671 Pain in right foot: Secondary | ICD-10-CM

## 2019-01-21 NOTE — Progress Notes (Signed)
  Subjective:  Patient ID: Christina Sampson, female    DOB: 1986-01-23,  MRN: [id number]  Chief Complaint  Patient presents with  . Foot Pain    Pt states bilateral dorsal foot "bumps". Right foot has shooting pains, 23mo duration, and redness at site of bump occasionally. Left has no pain.   . Foot Injury    Left 5th digit history of injury, no pain currently, pt believes it was broken in 07/2018    33 y.o. female presents with the above complaint.  History above confirmed with patient   Review of Systems: Negative except as noted in the HPI. Denies N/V/F/Ch.  Past Medical History:  Diagnosis Date  . Graves disease   . Normal pregnancy in multigravida in third trimester 12/20/2017  . Status post repeat low transverse cesarean section 01/07/2018    Current Outpatient Medications:  .  levothyroxine (SYNTHROID, LEVOTHROID) 137 MCG tablet, Take 137 mcg by mouth daily before breakfast., Disp: , Rfl:  .  betamethasone valerate (VALISONE) 0.1 % cream, APPLY TO AFFECTED AREA TWICE A DAY, Disp: , Rfl:  .  fluconazole (DIFLUCAN) 150 MG tablet, Take 1 tablet once for yeast infection; may repeat once in 3 days if needed, Disp: , Rfl:  .  fluticasone (FLONASE) 50 MCG/ACT nasal spray, Place into the nose., Disp: , Rfl:  .  ibuprofen (ADVIL,MOTRIN) 800 MG tablet, Take 1 tablet (800 mg total) by mouth every 8 (eight) hours. (Patient not taking: Reported on 01/21/2019), Disp: 30 tablet, Rfl: 0 .  Prenatal Vit-Fe Fumarate-FA (MULTIVITAMIN-PRENATAL) 27-0.8 MG TABS tablet, Take 1 tablet by mouth daily. , Disp: , Rfl:  .  ranitidine (ZANTAC) 150 MG tablet, Take 150 mg by mouth 2 (two) times daily., Disp: , Rfl:   Social History   Tobacco Use  Smoking Status Never Smoker  Smokeless Tobacco Never Used    No Known Allergies Objective:  There were no vitals filed for this visit. There is no height or weight on file to calculate BMI. Constitutional Well developed. Well nourished.  Vascular Dorsalis pedis  pulses palpable bilaterally. Posterior tibial pulses palpable bilaterally. Capillary refill normal to all digits.  No cyanosis or clubbing noted. Pedal hair growth normal.  Neurologic Normal speech. Oriented to person, place, and time. Epicritic sensation to light touch grossly present bilaterally.  Dermatologic Nails well groomed and normal in appearance. No open wounds. No skin lesions.  Orthopedic: pain to palpation at the right first tarsometatarsal joint with prominent Osteophyte formation normally   Radiographs: Taken and reviewed osteophyte formation first tarsometatarsal joint second metatarsal cortical thickening no acute fractures Assessment:   1. Arthritis, midfoot   2. Foot joint hypermobility    Plan:  Patient was evaluated and treated and all questions answered.  1st TMT Arthritis Bilat -Injection delivered to the right first TMT -May benefit from first tarsometatarsal fusion with bone spur excision  Procedure: Joint Injection Location: Right 1st TMT joint Skin Prep: Alcohol. Injectate: 0.5 cc 1% lidocaine plain, 0.5 cc dexamethasone phosphate. Disposition: Patient tolerated procedure well. Injection site dressed with a band-aid.  L 5th Toe Fx Hx -Taken and reviewed no evidence of continued fracture  Return in about 3 weeks (around 02/11/2019) for Arthritis, Right.

## 2019-01-21 NOTE — Patient Instructions (Signed)
Recommend Voltaren gel.

## 2019-02-02 ENCOUNTER — Ambulatory Visit: Payer: 59 | Admitting: Podiatry

## 2019-02-11 ENCOUNTER — Other Ambulatory Visit: Payer: Self-pay

## 2019-02-11 ENCOUNTER — Ambulatory Visit: Payer: 59 | Admitting: Podiatry

## 2019-02-11 DIAGNOSIS — M19079 Primary osteoarthritis, unspecified ankle and foot: Secondary | ICD-10-CM

## 2019-02-11 DIAGNOSIS — M357 Hypermobility syndrome: Secondary | ICD-10-CM | POA: Diagnosis not present

## 2019-03-12 NOTE — Progress Notes (Signed)
  Subjective:  Patient ID: Christina Sampson, female    DOB: Jan 01, 1986,  MRN: [id number]  Chief Complaint  Patient presents with  . Foot Pain    pt is here for arthritis f/u pt states that the injection she recieved last time has helped, pt also states that she has a hard time being able to afford orthotics, and is looking for alternative ways to help with her foot pain    33 y.o. female presents with the above complaint.  History above confirmed with patient  Review of Systems: Negative except as noted in the HPI. Denies N/V/F/Ch.  Past Medical History:  Diagnosis Date  . Graves disease   . Normal pregnancy in multigravida in third trimester 12/20/2017  . Status post repeat low transverse cesarean section 01/07/2018    Current Outpatient Medications:  .  fluticasone (FLONASE) 50 MCG/ACT nasal spray, Place into the nose., Disp: , Rfl:  .  betamethasone valerate (VALISONE) 0.1 % cream, APPLY TO AFFECTED AREA TWICE A DAY, Disp: , Rfl:  .  fluconazole (DIFLUCAN) 150 MG tablet, Take 1 tablet once for yeast infection; may repeat once in 3 days if needed, Disp: , Rfl:  .  ibuprofen (ADVIL,MOTRIN) 800 MG tablet, Take 1 tablet (800 mg total) by mouth every 8 (eight) hours. (Patient not taking: Reported on 01/21/2019), Disp: 30 tablet, Rfl: 0 .  levothyroxine (SYNTHROID, LEVOTHROID) 137 MCG tablet, Take 137 mcg by mouth daily before breakfast., Disp: , Rfl:  .  Prenatal Vit-Fe Fumarate-FA (MULTIVITAMIN-PRENATAL) 27-0.8 MG TABS tablet, Take 1 tablet by mouth daily. , Disp: , Rfl:  .  ranitidine (ZANTAC) 150 MG tablet, Take 150 mg by mouth 2 (two) times daily., Disp: , Rfl:   Social History   Tobacco Use  Smoking Status Never Smoker  Smokeless Tobacco Never Used    No Known Allergies Objective:  There were no vitals filed for this visit. There is no height or weight on file to calculate BMI. Constitutional Well developed. Well nourished.  Vascular Dorsalis pedis pulses palpable bilaterally.  Posterior tibial pulses palpable bilaterally. Capillary refill normal to all digits.  No cyanosis or clubbing noted. Pedal hair growth normal.  Neurologic Normal speech. Oriented to person, place, and time. Epicritic sensation to light touch grossly present bilaterally.  Dermatologic Nails well groomed and normal in appearance. No open wounds. No skin lesions.  Orthopedic: pain to palpation at the right first tarsometatarsal joint with prominent Osteophyte formation normally   Radiographs: none Assessment:   No diagnosis found. Plan:  Patient was evaluated and treated and all questions answered.  1st TMT Arthritis Bilat -Hold off injection today -Discussed in the future should pain persist may consider repeat injection or should she have continued pain but consider surgical intervention consisting of first tarsometatarsal arthrodesis versus excision of the bone spurs. -Discussed patient we can check on whether insurance will cover orthotics and if she would like to go through with them she can give [place] a call.  No follow-ups on file.

## 2020-02-08 IMAGING — MG DIGITAL DIAGNOSTIC BILATERAL MAMMOGRAM WITH TOMO AND CAD
8 series · 8 of 24 positions shown · non-contrast
Comparison: None.

CLINICAL DATA: 32-year-old female currently breast feeding states
she felt a mass in the 11 o'clock region of the right breast that
has resolved.

EXAM:
DIGITAL DIAGNOSTIC BILATERAL MAMMOGRAM WITH CAD AND TOMO
ULTRASOUND RIGHT BREAST

[R MLO synth-2D]
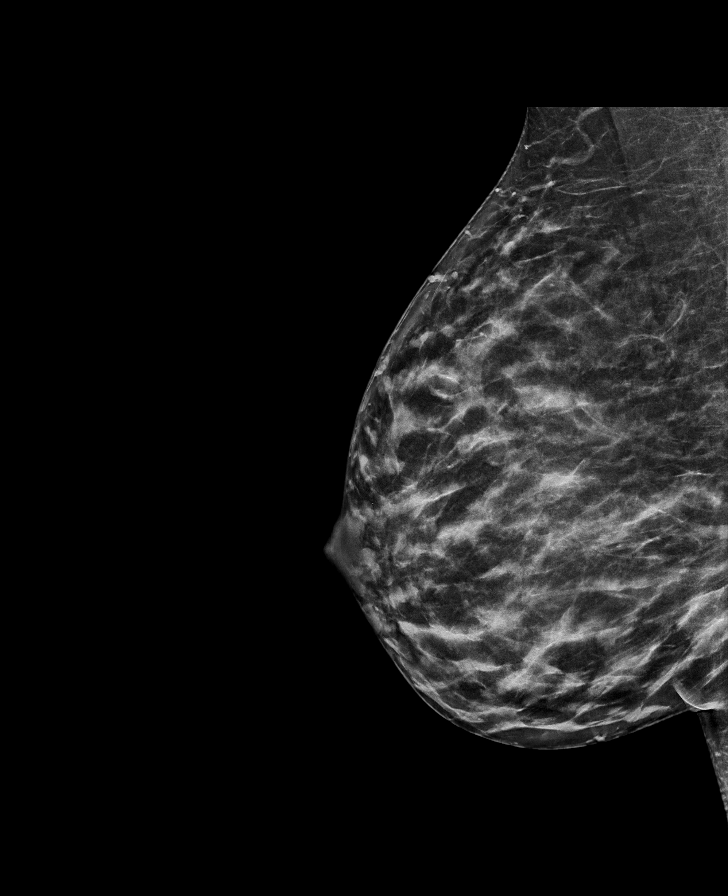

[R CC synth-2D]
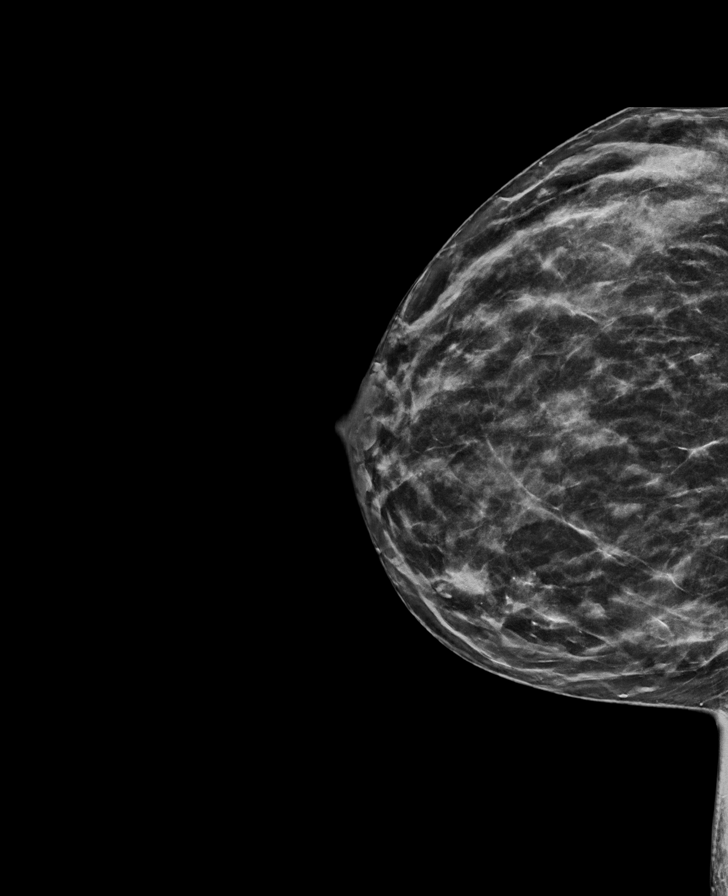

[L CC synth-2D]
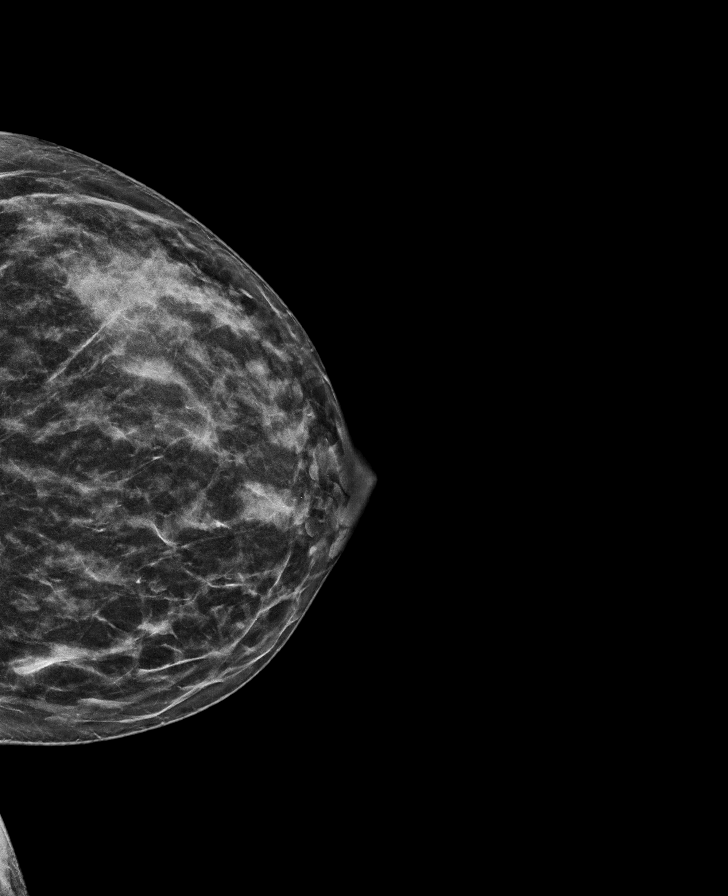

[L MLO synth-2D]
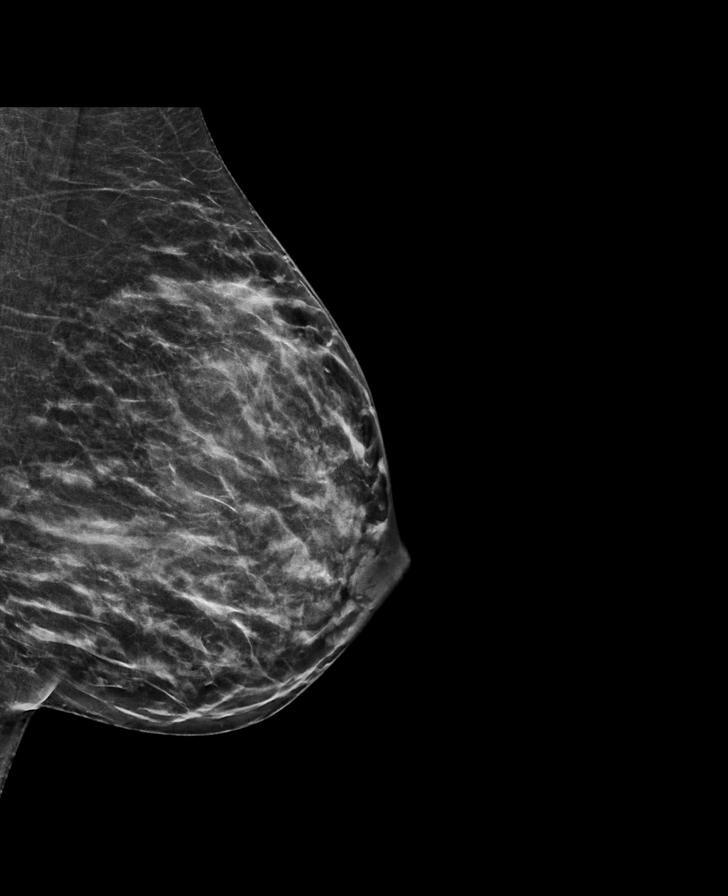

[R CC tomo · tomo slice 31/60.0]
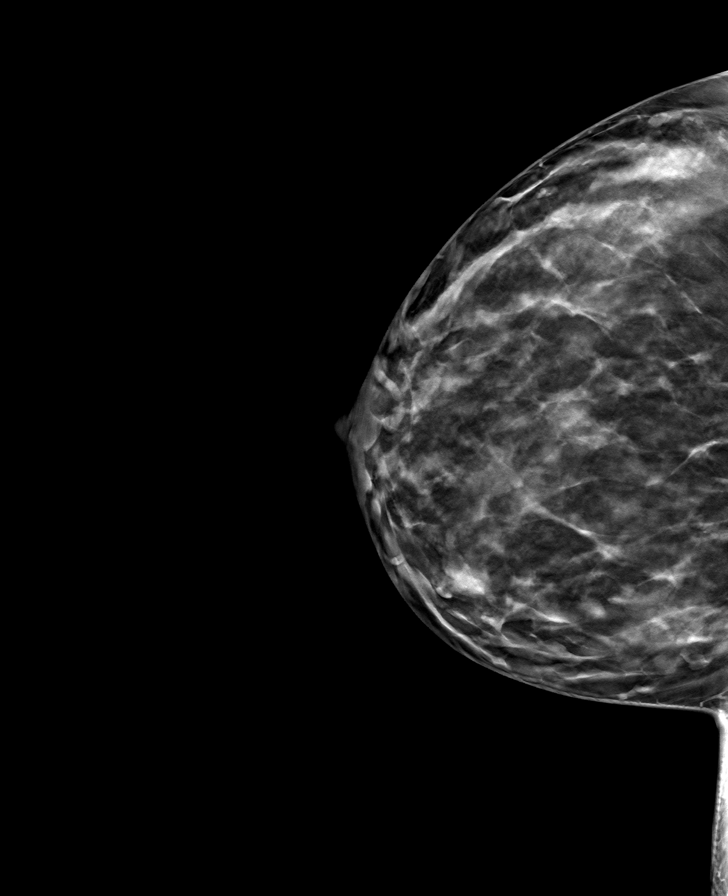

[L CC tomo · tomo slice 30/59.0]
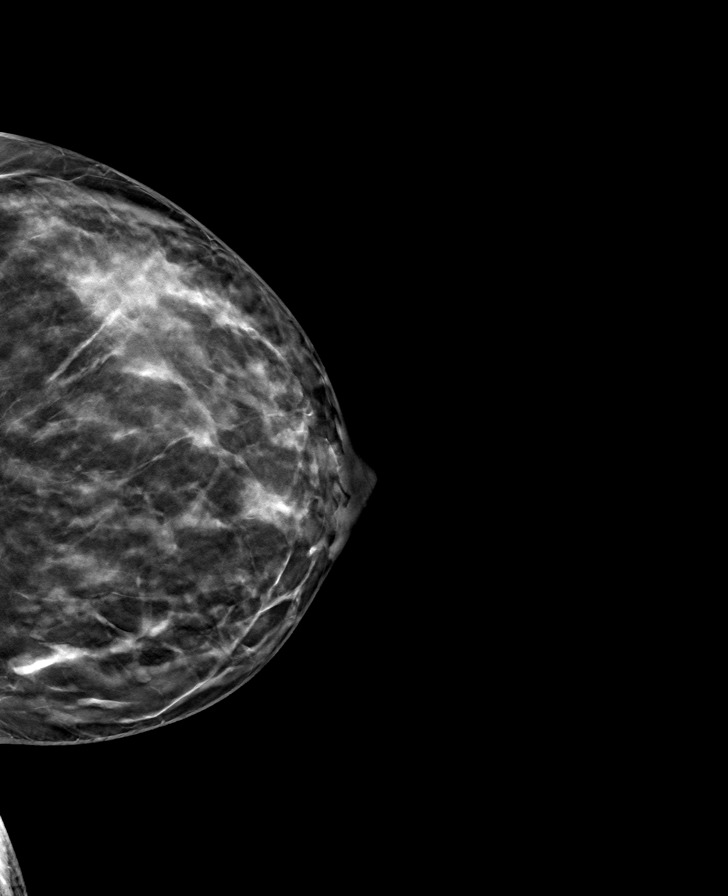

[L MLO tomo · tomo slice 31/61.0]
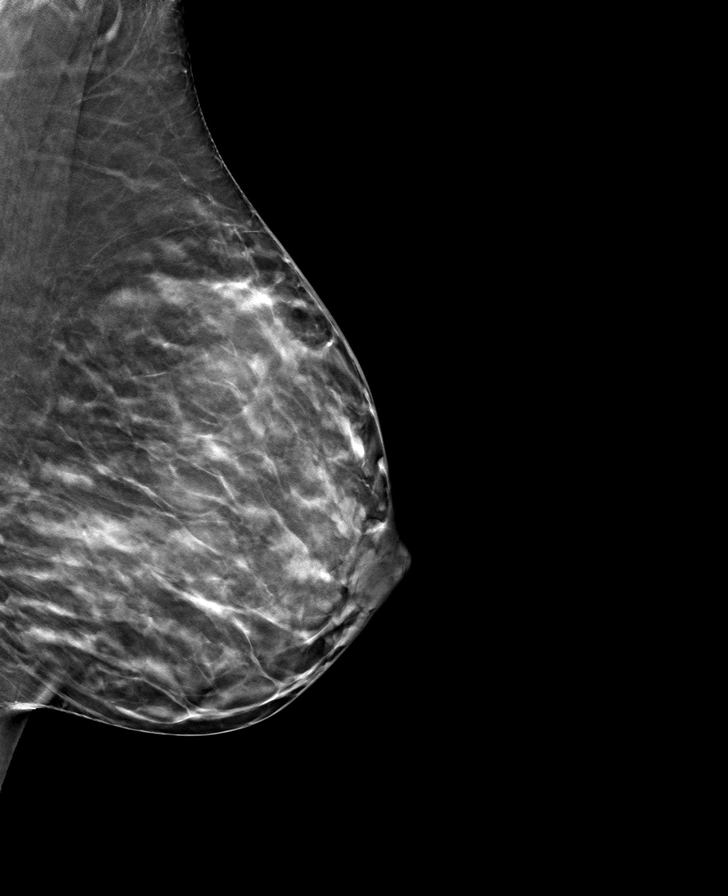

[R MLO tomo · tomo slice 33/64.0]
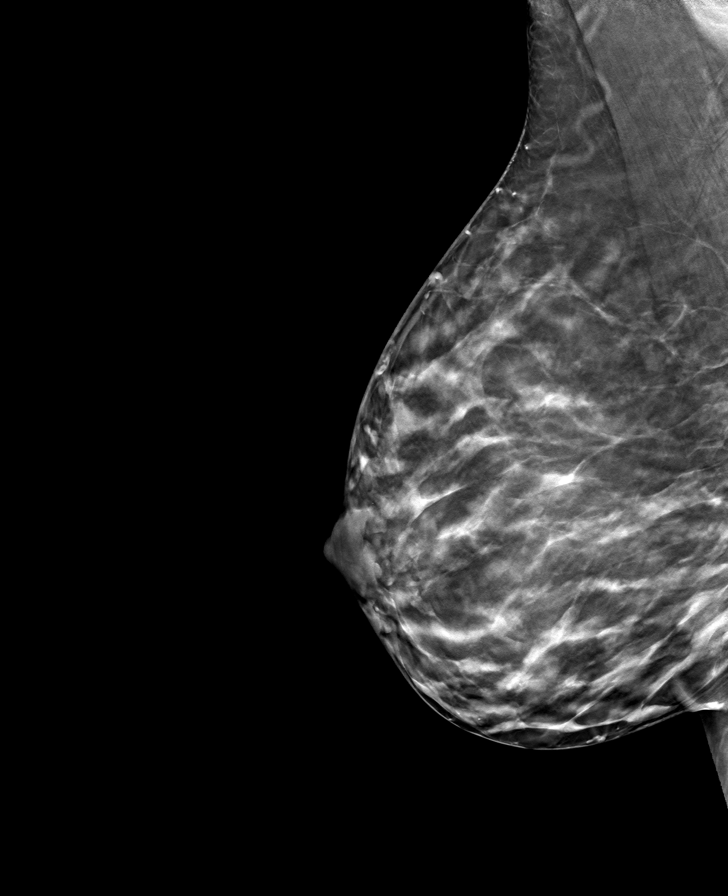

[8 of 24 positions shown; findings below may reference images not displayed]

ACR Breast Density Category d: The breast tissue is extremely dense,
which lowers the sensitivity of mammography.
FINDINGS: No suspicious mass, malignant type microcalcifications or distortion
detected in either breast.

Mammographic images were processed with CAD.

On physical exam, no mass is palpated in the right breast at 11
o'clock 4 cm from the nipple.

Targeted ultrasound is performed, showing normal tissue in the right
breast 11 o'clock 4 cm from the nipple. No solid or cystic mass,
abnormal shadowing or distortion visualized.
IMPRESSION: No evidence of malignancy in either breast.

RECOMMENDATION:
If the clinical exam remains benign/stable screening mammography can
be deferred until the age of 40.

I have discussed the findings and recommendations with the patient.
Results were also provided in writing at the conclusion of the
visit. If applicable, a reminder letter will be sent to the patient
regarding the next appointment.

BI-RADS CATEGORY  1: Negative.

## 2020-02-08 IMAGING — US ULTRASOUND RIGHT BREAST LIMITED
1 series · 2 of 2 positions shown · non-contrast
Comparison: None.

CLINICAL DATA: 32-year-old female currently breast feeding states
she felt a mass in the 11 o'clock region of the right breast that
has resolved.

EXAM:
DIGITAL DIAGNOSTIC BILATERAL MAMMOGRAM WITH CAD AND TOMO
ULTRASOUND RIGHT BREAST

[Series 1: ultrasound right breast limited · 0.06mm/px · 2 of 2 slices shown]
[im 1/2]
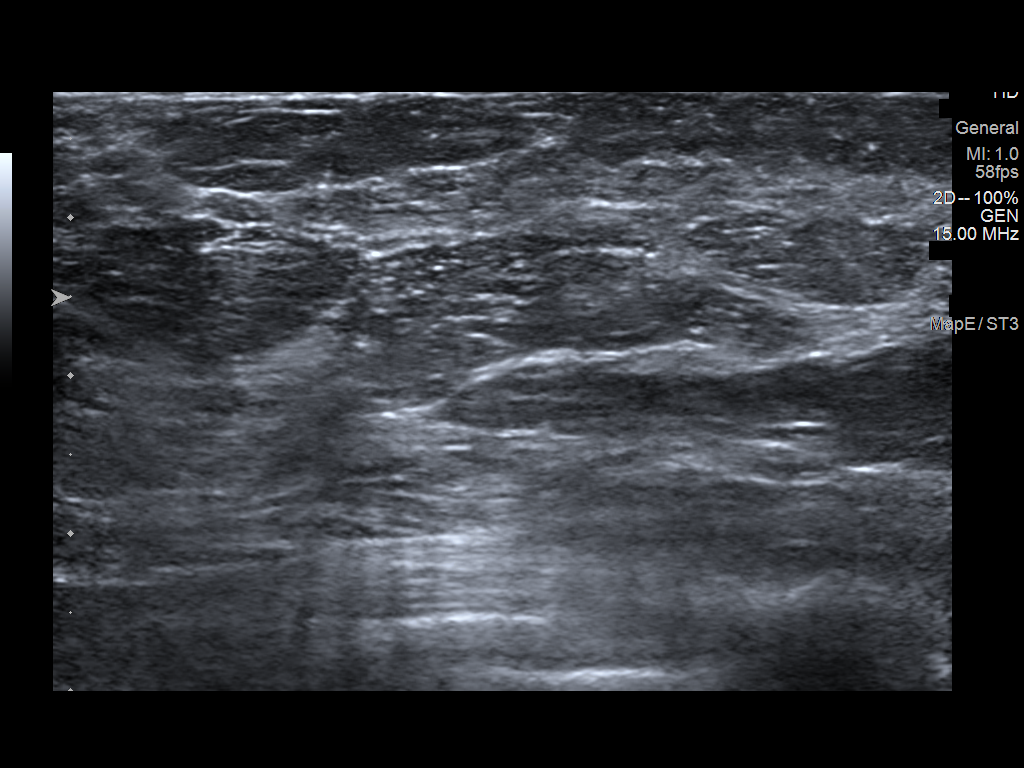
[im 2/2]
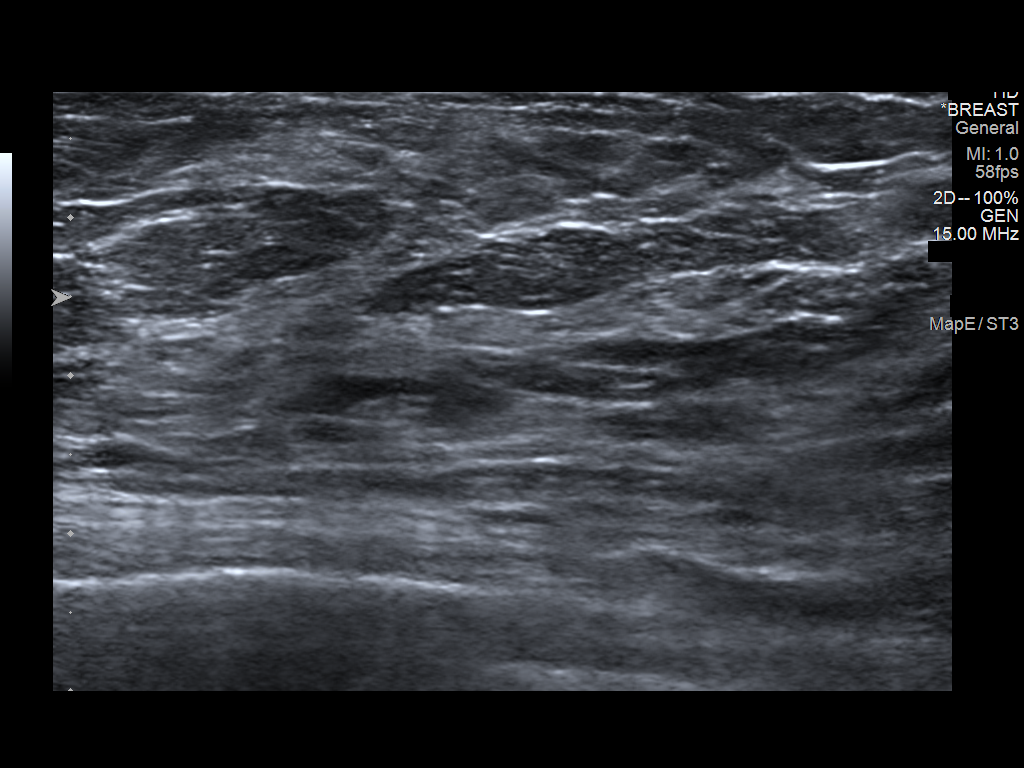

[2 of 2 positions shown; findings below may reference images not displayed]

ACR Breast Density Category d: The breast tissue is extremely dense,
which lowers the sensitivity of mammography.
FINDINGS: No suspicious mass, malignant type microcalcifications or distortion
detected in either breast.

Mammographic images were processed with CAD.

On physical exam, no mass is palpated in the right breast at 11
o'clock 4 cm from the nipple.

Targeted ultrasound is performed, showing normal tissue in the right
breast 11 o'clock 4 cm from the nipple. No solid or cystic mass,
abnormal shadowing or distortion visualized.
IMPRESSION: No evidence of malignancy in either breast.

RECOMMENDATION:
If the clinical exam remains benign/stable screening mammography can
be deferred until the age of 40.

I have discussed the findings and recommendations with the patient.
Results were also provided in writing at the conclusion of the
visit. If applicable, a reminder letter will be sent to the patient
regarding the next appointment.

BI-RADS CATEGORY  1: Negative.
